# Patient Record
Sex: Female | Born: 1970 | Race: White | Hispanic: No | Marital: Single | Smoking: Former smoker
Health system: Southern US, Community
[De-identification: ages and names within clinical notes are randomized; demographics above are authoritative.]

## PROBLEM LIST (undated history)

## (undated) DIAGNOSIS — F329 Major depressive disorder, single episode, unspecified: Secondary | ICD-10-CM

## (undated) DIAGNOSIS — J45909 Unspecified asthma, uncomplicated: Secondary | ICD-10-CM

## (undated) DIAGNOSIS — F32A Depression, unspecified: Secondary | ICD-10-CM

## (undated) DIAGNOSIS — F419 Anxiety disorder, unspecified: Secondary | ICD-10-CM

## (undated) DIAGNOSIS — N6029 Fibroadenosis of unspecified breast: Secondary | ICD-10-CM

## (undated) DIAGNOSIS — K649 Unspecified hemorrhoids: Secondary | ICD-10-CM

## (undated) DIAGNOSIS — K769 Liver disease, unspecified: Secondary | ICD-10-CM

## (undated) DIAGNOSIS — R55 Syncope and collapse: Secondary | ICD-10-CM

## (undated) DIAGNOSIS — R51 Headache: Secondary | ICD-10-CM

## (undated) HISTORY — DX: Headache: R51

## (undated) HISTORY — PX: BREAST SURGERY: SHX581

## (undated) HISTORY — DX: Unspecified asthma, uncomplicated: J45.909

## (undated) HISTORY — DX: Unspecified hemorrhoids: K64.9

## (undated) HISTORY — DX: Syncope and collapse: R55

## (undated) HISTORY — DX: Depression, unspecified: F32.A

## (undated) HISTORY — DX: Major depressive disorder, single episode, unspecified: F32.9

## (undated) HISTORY — DX: Liver disease, unspecified: K76.9

## (undated) HISTORY — DX: Anxiety disorder, unspecified: F41.9

## (undated) HISTORY — DX: Fibroadenosis of unspecified breast: N60.29

---

## 2001-04-30 ENCOUNTER — Other Ambulatory Visit: Admission: RE | Admit: 2001-04-30 | Discharge: 2001-04-30 | Payer: Self-pay | Admitting: Obstetrics and Gynecology

## 2004-08-28 ENCOUNTER — Emergency Department (HOSPITAL_COMMUNITY): Admission: EM | Admit: 2004-08-28 | Discharge: 2004-08-28 | Payer: Self-pay | Admitting: Emergency Medicine

## 2010-04-22 ENCOUNTER — Emergency Department (HOSPITAL_COMMUNITY): Admission: EM | Admit: 2010-04-22 | Discharge: 2010-04-22 | Payer: Self-pay | Admitting: Emergency Medicine

## 2011-04-27 ENCOUNTER — Encounter: Payer: Self-pay | Admitting: Gastroenterology

## 2011-05-22 ENCOUNTER — Ambulatory Visit: Payer: Self-pay | Admitting: Gastroenterology

## 2012-06-04 ENCOUNTER — Encounter: Payer: Self-pay | Admitting: Gastroenterology

## 2012-07-02 ENCOUNTER — Other Ambulatory Visit (INDEPENDENT_AMBULATORY_CARE_PROVIDER_SITE_OTHER): Payer: Medicare HMO

## 2012-07-02 ENCOUNTER — Ambulatory Visit (INDEPENDENT_AMBULATORY_CARE_PROVIDER_SITE_OTHER): Payer: Medicare HMO | Admitting: Gastroenterology

## 2012-07-02 ENCOUNTER — Encounter: Payer: Self-pay | Admitting: Gastroenterology

## 2012-07-02 VITALS — BP 124/90 | HR 80 | Ht 64.25 in | Wt 179.1 lb

## 2012-07-02 DIAGNOSIS — K7689 Other specified diseases of liver: Secondary | ICD-10-CM

## 2012-07-02 DIAGNOSIS — K769 Liver disease, unspecified: Secondary | ICD-10-CM

## 2012-07-02 LAB — CBC WITH DIFFERENTIAL/PLATELET
Basophils Relative: 0.1 % (ref 0.0–3.0)
Eosinophils Relative: 5.1 % — ABNORMAL HIGH (ref 0.0–5.0)
HCT: 37.3 % (ref 36.0–46.0)
Hemoglobin: 12.5 g/dL (ref 12.0–15.0)
Lymphs Abs: 1.7 10*3/uL (ref 0.7–4.0)
MCV: 85.7 fl (ref 78.0–100.0)
Monocytes Absolute: 0.4 10*3/uL (ref 0.1–1.0)
Monocytes Relative: 6.7 % (ref 3.0–12.0)
Neutro Abs: 3.3 10*3/uL (ref 1.4–7.7)
WBC: 5.7 10*3/uL (ref 4.5–10.5)

## 2012-07-02 LAB — COMPREHENSIVE METABOLIC PANEL
Alkaline Phosphatase: 32 U/L — ABNORMAL LOW (ref 39–117)
BUN: 11 mg/dL (ref 6–23)
CO2: 23 mEq/L (ref 19–32)
Creatinine, Ser: 0.7 mg/dL (ref 0.4–1.2)
GFR: 102.65 mL/min (ref >60.00)
Glucose, Bld: 92 mg/dL (ref 70–99)
Total Bilirubin: 0.4 mg/dL (ref 0.3–1.2)
Total Protein: 6.6 g/dL (ref 6.0–8.3)

## 2012-07-02 LAB — AFP TUMOR MARKER: AFP-Tumor Marker: 1.6 ng/mL (ref 0.0–8.0)

## 2012-07-02 NOTE — Patient Instructions (Addendum)
One of your biggest health concerns is your smoking.  This increases your risk for most cancers and serious cardiovascular diseases such as strokes, heart attacks.  You should try your best to stop.  If you need assistance, please contact your PCP or Smoking Cessation Class at [facility] [phone]) or [facility] Quit-Line (1-800-QUIT-NOW). MRI (with and without contrast) of liver to characterize the liver lesion noted on 05/2012 CT scan   You have been scheduled for an MRI on  07/04/12 at 9 am.  Please arrive 15 minutes prior to your appointment time for registration purposes. Nothing to eat or drink  4 hours prior to the test. If you have any metal in your body, have a pacemaker or defibrillator, please be sure to let your ordering physician know. This test typically takes 45 minutes to 1 hour to complete. You will have labs checked today in the basement lab.  Please head down after you check out with the front desk  (cbc, cmet, AFP). You need to be up to date on age appropriate cancer screening (you should call PCP about this).   Copy of this sent to Dr. Lenon Ahmadi at [facility].

## 2012-07-02 NOTE — Progress Notes (Signed)
HPI: This is a     very pleasant 41 year old woman who is here with her husband today.  Was found to have a "non-specific 13mm by 9mm lesion in right lobe of liver."  She was thought to be passing a kidney stone, had a CT scan.  Sharp/dull acute pains in left side low back. Orthopedice evaluation felt the pain was not musculoskeltal  Has never had liver problems.  Unknown if liver disease in family.  Brother has gilbert's  Overall weight has been up in past 6-8 weeks.  Has tried various diet pills.  No right sided abd pains.  No BCPs no hormone replacement.  Smoke cigs daily, cutting.  Not much etoh weekly.   She has not had a mammogram in many years, she did have a Pap smear last year. She does not have colon cancer in her family.     Review of systems: Pertinent positive and negative review of systems were noted in the above HPI section. Complete review of systems was performed and was otherwise normal.    Past Medical History  Diagnosis Date  . Fibroadenosis of breast   . Unspecified hemorrhoids without mention of complication   . Liver lesion   . Anxiety   . Asthma   . Depression     Past Surgical History  Procedure Date  . Breast surgery     right tumor removed    Current Outpatient Prescriptions  Medication Sig Dispense Refill  . amoxicillin-clavulanate (AUGMENTIN) 875-125 MG per tablet Take 1 tablet by mouth 2 (two) times daily.      . cyclobenzaprine (FLEXERIL) 5 MG tablet Take 5 mg by mouth as needed.        Allergies as of 07/02/2012  . (No Known Allergies)    Family History  Problem Relation Age of Onset  . Diabetes Father   . Diabetes Maternal Grandfather   . Heart disease Mother   . Breast cancer Maternal Aunt   . Lung cancer Maternal Grandfather     maternal aunt  . Throat cancer Maternal Grandfather     maternal uncle  . Hypertension Mother   . Arthritis/Rheumatoid Mother   . Seizures Brother   . Ovarian cancer Maternal Aunt     x 4   . Kidney disease Maternal Aunt   . Other Brother     Gilbert's syndrome    History   Social History  . Marital Status: Single    Spouse Name: N/A    Number of Children: 3  . Years of Education: N/A   Occupational History  . mortgage banking    Social History Main Topics  . Smoking status: Current Every Day Smoker    Types: Cigarettes  . Smokeless tobacco: Never Used  . Alcohol Use: Yes     Comment: 1-2 per night  . Drug Use: No  . Sexually Active: Not on file   Other Topics Concern  . Not on file   Social History Narrative  . No narrative on file       Physical Exam: BP 124/90  Pulse 80  Ht 5' 4.25" (1.632 m)  Wt 179 lb 2 oz (81.251 kg)  BMI 30.51 kg/m2  LMP 06/23/2012 Constitutional: generally well-appearing Psychiatric: alert and oriented x3 Eyes: extraocular movements intact Mouth: oral pharynx moist, no lesions Neck: supple no lymphadenopathy Cardiovascular: heart regular rate and rhythm Lungs: clear to auscultation bilaterally Abdomen: soft, nontender, nondistended, no obvious ascites, no peritoneal signs, normal bowel sounds Extremities:  no lower extremity edema bilaterally Skin: no lesions on visible extremities    Assessment and plan: 41 y.o. female with  incidental small right lobe liver lesion  the best imaging test for lesions of the liver is an MRI with and without IV contrast and we will order that for her. I will also have basic labs done CBC, complete metabolic profile, alpha-fetoprotein. She does not require colon cancer screening at her age. I will defer other screening recommendations to her primary care physician. I do think that she should be up to date on any age appropriate screening given this finding. I think it is unlikely gaining serious however if the MRI results are equivocal then she might require liver biopsy.

## 2012-07-04 ENCOUNTER — Ambulatory Visit (HOSPITAL_COMMUNITY)
Admission: RE | Admit: 2012-07-04 | Discharge: 2012-07-04 | Disposition: A | Discharge status: Home / Self Care | Payer: Medicare HMO | Source: Ambulatory Visit | Attending: Gastroenterology | Admitting: Gastroenterology

## 2012-07-04 DIAGNOSIS — R109 Unspecified abdominal pain: Secondary | ICD-10-CM | POA: Insufficient documentation

## 2012-07-04 DIAGNOSIS — Q619 Cystic kidney disease, unspecified: Secondary | ICD-10-CM | POA: Insufficient documentation

## 2012-07-04 DIAGNOSIS — R319 Hematuria, unspecified: Secondary | ICD-10-CM | POA: Insufficient documentation

## 2012-07-04 DIAGNOSIS — K7689 Other specified diseases of liver: Secondary | ICD-10-CM | POA: Insufficient documentation

## 2012-07-04 DIAGNOSIS — K769 Liver disease, unspecified: Secondary | ICD-10-CM

## 2012-07-04 MED ORDER — GADOBENATE DIMEGLUMINE 529 MG/ML IV SOLN
16.0000 mL | Freq: Once | INTRAVENOUS | Status: AC | PRN
  Administered 2012-07-04: 16 mL via INTRAVENOUS

## 2012-07-07 ENCOUNTER — Emergency Department (HOSPITAL_BASED_OUTPATIENT_CLINIC_OR_DEPARTMENT_OTHER)
Admission: EM | Admit: 2012-07-07 | Discharge: 2012-07-07 | Disposition: A | Discharge status: Home / Self Care | Payer: Managed Care, Other (non HMO) | Attending: Emergency Medicine | Admitting: Emergency Medicine

## 2012-07-07 ENCOUNTER — Encounter (HOSPITAL_BASED_OUTPATIENT_CLINIC_OR_DEPARTMENT_OTHER): Payer: Self-pay | Admitting: Emergency Medicine

## 2012-07-07 DIAGNOSIS — IMO0001 Reserved for inherently not codable concepts without codable children: Secondary | ICD-10-CM | POA: Insufficient documentation

## 2012-07-07 DIAGNOSIS — S61409A Unspecified open wound of unspecified hand, initial encounter: Secondary | ICD-10-CM | POA: Insufficient documentation

## 2012-07-07 DIAGNOSIS — F17200 Nicotine dependence, unspecified, uncomplicated: Secondary | ICD-10-CM | POA: Insufficient documentation

## 2012-07-07 DIAGNOSIS — Z8719 Personal history of other diseases of the digestive system: Secondary | ICD-10-CM | POA: Insufficient documentation

## 2012-07-07 DIAGNOSIS — Z8679 Personal history of other diseases of the circulatory system: Secondary | ICD-10-CM | POA: Insufficient documentation

## 2012-07-07 DIAGNOSIS — J45909 Unspecified asthma, uncomplicated: Secondary | ICD-10-CM | POA: Insufficient documentation

## 2012-07-07 DIAGNOSIS — Z8659 Personal history of other mental and behavioral disorders: Secondary | ICD-10-CM | POA: Insufficient documentation

## 2012-07-07 DIAGNOSIS — Z23 Encounter for immunization: Secondary | ICD-10-CM | POA: Insufficient documentation

## 2012-07-07 DIAGNOSIS — Y939 Activity, unspecified: Secondary | ICD-10-CM | POA: Insufficient documentation

## 2012-07-07 DIAGNOSIS — S61459A Open bite of unspecified hand, initial encounter: Secondary | ICD-10-CM

## 2012-07-07 DIAGNOSIS — S81009A Unspecified open wound, unspecified knee, initial encounter: Secondary | ICD-10-CM | POA: Insufficient documentation

## 2012-07-07 DIAGNOSIS — Z8742 Personal history of other diseases of the female genital tract: Secondary | ICD-10-CM | POA: Insufficient documentation

## 2012-07-07 DIAGNOSIS — S81859A Open bite, unspecified lower leg, initial encounter: Secondary | ICD-10-CM

## 2012-07-07 DIAGNOSIS — Z79899 Other long term (current) drug therapy: Secondary | ICD-10-CM | POA: Insufficient documentation

## 2012-07-07 DIAGNOSIS — Y92009 Unspecified place in unspecified non-institutional (private) residence as the place of occurrence of the external cause: Secondary | ICD-10-CM | POA: Insufficient documentation

## 2012-07-07 MED ORDER — TETANUS-DIPHTH-ACELL PERTUSSIS 5-2.5-18.5 LF-MCG/0.5 IM SUSP
0.5000 mL | Freq: Once | INTRAMUSCULAR | Status: AC
  Administered 2012-07-07: 0.5 mL via INTRAMUSCULAR
  Filled 2012-07-07: qty 0.5

## 2012-07-07 MED ORDER — AMOXICILLIN-POT CLAVULANATE 875-125 MG PO TABS: 1.0000 | ORAL_TABLET | Freq: Two times a day (BID) | ORAL | Status: DC

## 2012-07-07 NOTE — ED Notes (Signed)
Pt bit and scratched left hand and left leg yesterday.  Pt has puncture wounds and scratches to both.  Some swelling noted.  Pt's own cat, no immunizations given as cat is 1 months old.

## 2012-07-07 NOTE — ED Provider Notes (Signed)
History     CSN: [id number]  Arrival date & time 07/07/12  1034   First MD Initiated Contact with Patient 07/07/12 1131      Chief Complaint  Patient presents with  . Animal Bite    (Consider location/radiation/quality/duration/timing/severity/associated sxs/prior treatment) HPI Comments: Patient was bitten on the left hand and left leg by a cat.  Cat belongs to patient, immunizations have not been given yet.  Animal is indoor and otherwise healthy.  Patient is a 41 y.o. female presenting with animal bite. The history is provided by the patient.  Animal Bite  The incident occurred yesterday. The incident occurred at home. She came to the ER via personal transport. Head/neck injury location: left hand, left leg. The pain is moderate. It is unlikely that a foreign body is present.    Past Medical History  Diagnosis Date  . Fibroadenosis of breast   . Unspecified hemorrhoids without mention of complication   . Liver lesion   . Anxiety   . Asthma   . Depression     Past Surgical History  Procedure Date  . Breast surgery     right tumor removed    Family History  Problem Relation Age of Onset  . Diabetes Father   . Diabetes Maternal Grandfather   . Heart disease Mother   . Breast cancer Maternal Aunt   . Lung cancer Maternal Grandfather     maternal aunt  . Throat cancer Maternal Grandfather     maternal uncle  . Hypertension Mother   . Arthritis/Rheumatoid Mother   . Seizures Brother   . Ovarian cancer Maternal Aunt     x 4  . Kidney disease Maternal Aunt   . Other Brother     Gilbert's syndrome    History  Substance Use Topics  . Smoking status: Current Every Day Smoker    Types: Cigarettes  . Smokeless tobacco: Never Used  . Alcohol Use: Yes     Comment: 1-2 per night    OB History    Grav Para Term Preterm Abortions TAB SAB Ect Mult Living                  Review of Systems  All other systems reviewed and are negative.    Allergies    Review of patient's allergies indicates no known allergies.  Home Medications   Current Outpatient Rx  Name  Route  Sig  Dispense  Refill  . AMOXICILLIN-POT CLAVULANATE 875-125 MG PO TABS   Oral   Take 1 tablet by mouth 2 (two) times daily.         . CYCLOBENZAPRINE HCL 5 MG PO TABS   Oral   Take 5 mg by mouth as needed.           BP 137/84  Pulse 81  Temp 98.2 F (36.8 C) (Oral)  Resp 14  Ht 5\' 4"  (1.626 m)  Wt 179 lb (81.194 kg)  BMI 30.73 kg/m2  SpO2 100%  LMP 06/23/2012  Physical Exam  Nursing note and vitals reviewed. Constitutional: She is oriented to person, place, and time. She appears well-developed and well-nourished. No distress.  HENT:  Head: Normocephalic and atraumatic.  Neck: Normal range of motion. Neck supple.  Musculoskeletal: Normal range of motion.       The left hand is noted to have a small puncture to the dorsal aspect between the 4th and 5th metacarpals.  There is mild surrounding erythema.  There is good  range of motion with minimal pain.  Neurological: She is alert and oriented to person, place, and time.  Skin: Skin is warm and dry. She is not diaphoretic.    ED Course  Procedures (including critical care time)  Labs Reviewed - No data to display No results found.   No diagnosis found.    MDM  The patient presents with a cat bite to the hand.  She has been on Augmentin already for a uti.  I will continue this for another 5 days.  The dtap was updated.  To return prn if she worsens.        Geoffery Lyons, MD 07/07/12 1146

## 2017-05-17 ENCOUNTER — Emergency Department (HOSPITAL_COMMUNITY): Payer: 59

## 2017-05-17 ENCOUNTER — Encounter (HOSPITAL_COMMUNITY): Payer: Self-pay | Admitting: Emergency Medicine

## 2017-05-17 ENCOUNTER — Emergency Department (HOSPITAL_COMMUNITY)
Admission: EM | Admit: 2017-05-17 | Discharge: 2017-05-17 | Disposition: A | Discharge status: Home / Self Care | Payer: 59 | Attending: Emergency Medicine | Admitting: Emergency Medicine

## 2017-05-17 ENCOUNTER — Other Ambulatory Visit: Payer: Self-pay

## 2017-05-17 DIAGNOSIS — R42 Dizziness and giddiness: Secondary | ICD-10-CM | POA: Insufficient documentation

## 2017-05-17 DIAGNOSIS — R51 Headache: Secondary | ICD-10-CM | POA: Diagnosis not present

## 2017-05-17 DIAGNOSIS — R002 Palpitations: Secondary | ICD-10-CM | POA: Diagnosis present

## 2017-05-17 DIAGNOSIS — R531 Weakness: Secondary | ICD-10-CM | POA: Diagnosis not present

## 2017-05-17 DIAGNOSIS — R519 Headache, unspecified: Secondary | ICD-10-CM

## 2017-05-17 DIAGNOSIS — F17210 Nicotine dependence, cigarettes, uncomplicated: Secondary | ICD-10-CM | POA: Diagnosis not present

## 2017-05-17 DIAGNOSIS — J45909 Unspecified asthma, uncomplicated: Secondary | ICD-10-CM | POA: Diagnosis not present

## 2017-05-17 LAB — BASIC METABOLIC PANEL
ANION GAP: 6 (ref 5–15)
BUN: 11 mg/dL (ref 6–20)
CALCIUM: 9 mg/dL (ref 8.9–10.3)
CO2: 25 mmol/L (ref 22–32)
CREATININE: 0.64 mg/dL (ref 0.44–1.00)
Chloride: 106 mmol/L (ref 101–111)
GFR calc Af Amer: >60 mL/min (ref >60)
GLUCOSE: 83 mg/dL (ref 65–99)
Potassium: 3.8 mmol/L (ref 3.5–5.1)
Sodium: 137 mmol/L (ref 135–145)

## 2017-05-17 LAB — CBC
HCT: 43.2 % (ref 36.0–46.0)
HEMOGLOBIN: 14 g/dL (ref 12.0–15.0)
MCH: 27.3 pg (ref 26.0–34.0)
MCHC: 32.4 g/dL (ref 30.0–36.0)
MCV: 84.4 fL (ref 78.0–100.0)
Platelets: 254 10*3/uL (ref 150–400)
RBC: 5.12 MIL/uL — ABNORMAL HIGH (ref 3.87–5.11)
RDW: 14.7 % (ref 11.5–15.5)
WBC: 6.2 10*3/uL (ref 4.0–10.5)

## 2017-05-17 LAB — I-STAT TROPONIN, ED: TROPONIN I, POC: 0 ng/mL (ref 0.00–0.08)

## 2017-05-17 LAB — POC URINE PREG, ED: Preg Test, Ur: NEGATIVE

## 2017-05-17 MED ORDER — IBUPROFEN 800 MG PO TABS: 800.0000 mg | ORAL_TABLET | Freq: Three times a day (TID) | ORAL | 0 refills | Status: AC | PRN

## 2017-05-17 MED ORDER — METOCLOPRAMIDE HCL 5 MG/ML IJ SOLN
10.0000 mg | Freq: Once | INTRAMUSCULAR | Status: DC
  Filled 2017-05-17: qty 2

## 2017-05-17 MED ORDER — SODIUM CHLORIDE 0.9 % IV BOLUS (SEPSIS): 1000.0000 mL | Freq: Once | INTRAVENOUS | Status: DC

## 2017-05-17 NOTE — Discharge Instructions (Signed)
It was my pleasure taking care of you today!    Please take Ibuprofen with caffeine three times daily as needed for headache. Extra strength Tylenol for additional pain relief as needed. Stay hydrated. This will help with your headache and dizziness.   It is very important that you call the neurology clinic first thing tomorrow morning to schedule a follow up appointment.   Fortunately, your evaluation today is reassuring with no apparent emergent cause for your headache at this time. With that being said, it is VERY important that you monitor your symptoms at home. If you develop worsening headache, new fever, new neck stiffness, rash, weakness, numbness, trouble with your speech, trouble walking, new or worsening symptoms or any concerning symptoms, please return to the ED immediately.

## 2017-05-17 NOTE — ED Triage Notes (Signed)
To ED via [facility] from home with c/o weakness, dizziness and palpitations-pt had runs of svt for EMS , not captured by EKG--  On arrival, pt states that this weakness and dizziness started yesterday, has

## 2017-05-17 NOTE — ED Provider Notes (Signed)
[facility] Provider Note   CSN: [id number] Arrival date & time: 05/17/17  1345     History   Chief Complaint Chief Complaint  Patient presents with  . Palpitations    HPI Sharon Howard is a 46 y.o. female.  The history is provided by the patient and medical records. No language interpreter was used.  Palpitations   Associated symptoms include headaches, dizziness and weakness. Pertinent negatives include no chest pain.   Sharon Howard is a 46 y.o. female  with a PMH of anxiety, depression, asthma who presents to the Emergency Department complaining of numbness which began yesterday.  She notes that it is intermittent, worse with certain positions like moving from sitting to standing. She describes the dizziness as her vision darkening and then coming back.  It is associated with weakness, blurry vision in both eyes and a tingling sensation on the right side of her head.  She denies syncopal event, but states that her vision will tunnel and she is unable to respond.  She is aware of her surroundings and can hear everything going on around her, but does not feel like she can respond.  She is unsure of timeframe, but feels like this lasts a few seconds to minutes.  EMS was called and per their report, she had a run of SVT with heart rate in the 160s.  This was not captured on EKG.  Heart rate in the 70s in normal sinus rhythm upon arrival to ER.  Chest pain or shortness of breath.  Denies abdominal pain, nausea, vomiting. No fever, chills. She does report having a headache intermittently over the last 4-5 weeks. Denies hx of migraines. Mother was diagnosed with acoustic neuroma and just had surgery for this.    Past Medical History:  Diagnosis Date  . Anxiety   . Asthma   . Depression   . Fibroadenosis of breast   . Liver lesion   . Unspecified hemorrhoids without mention of complication     Patient Active Problem List   Diagnosis Date  Noted  . Liver lesion 07/02/2012    Past Surgical History:  Procedure Laterality Date  . BREAST SURGERY     right tumor removed    OB History    No data available       Home Medications    Prior to Admission medications   Medication Sig Start Date End Date Taking? Authorizing Provider  amoxicillin-clavulanate (AUGMENTIN) 875-125 MG per tablet Take 1 tablet by mouth 2 (two) times daily. One po bid x 7 days Patient not taking: Reported on 05/17/2017 07/07/12   Geoffery Lyonselo, Douglas, MD  ibuprofen (ADVIL,MOTRIN) 800 MG tablet Take 1 tablet (800 mg total) every 8 (eight) hours as needed by mouth. 05/17/17   Daxon Kyne, Chase PicketJaime Pilcher, PA-C    Family History Family History  Problem Relation Age of Onset  . Diabetes Father   . Diabetes Maternal Grandfather   . Lung cancer Maternal Grandfather        maternal aunt  . Throat cancer Maternal Grandfather        maternal uncle  . Heart disease Mother   . Hypertension Mother   . Arthritis/Rheumatoid Mother   . Breast cancer Maternal Aunt   . Seizures Brother   . Ovarian cancer Maternal Aunt        x 4  . Kidney disease Maternal Aunt   . Other Brother        Gilbert's syndrome  Social History Social History   Tobacco Use  . Smoking status: Current Every Day Smoker    Types: Cigarettes  . Smokeless tobacco: Never Used  Substance Use Topics  . Alcohol use: Yes    Comment: 1-2 per night  . Drug use: No     Allergies   Patient has no known allergies.   Review of Systems Review of Systems  Cardiovascular: Positive for palpitations. Negative for chest pain and leg swelling.  Neurological: Positive for dizziness, weakness and headaches.     Physical Exam Updated Vital Signs BP 102/83   Pulse 65   Temp 98.8 F (37.1 C) (Oral)   Resp 12   Wt 81.2 kg (179 lb)   SpO2 97%   BMI 30.73 kg/m   Physical Exam  Constitutional: She is oriented to person, place, and time. She appears well-developed and well-nourished. No distress.   HENT:  Head: Normocephalic and atraumatic.  Neck: Neck supple.  Cardiovascular: Normal rate, regular rhythm and normal heart sounds.  No murmur heard. Regular rate and rhythm on exam and during orthostatic vitals.  Pulmonary/Chest: Effort normal and breath sounds normal. No stridor. No respiratory distress. She has no wheezes. She has no rales.  Abdominal: Soft. She exhibits no distension. There is no tenderness.  Musculoskeletal: She exhibits no edema.  Neurological: She is alert and oriented to person, place, and time.  Alert, oriented, thought content appropriate, able to give a coherent history. Speech is clear and goal oriented, able to follow commands.  Cranial Nerves:  II:  Peripheral visual fields grossly normal, pupils equal, round, reactive to light III, IV, VI: EOM intact bilaterally, ptosis not present V,VII: smile symmetric, eyes kept closed tightly against resistance, facial light touch sensation equal VIII: hearing grossly normal IX, X: symmetric soft palate movement, uvula elevates symmetrically  XI: bilateral shoulder shrug symmetric and strong XII: midline tongue extension 5/5 muscle strength in upper and lower extremities bilaterally including strong and equal grip strength and dorsiflexion/plantar flexion Sensory to light touch normal in all four extremities.  Normal finger-to-nose and rapid alternating movements;  no drift.  Skin: Skin is warm and dry.  Nursing note and vitals reviewed.    ED Treatments / Results  Labs (all labs ordered are listed, but only abnormal results are displayed) Labs Reviewed  CBC - Abnormal; Notable for the following components:      Result Value   RBC 5.12 (*)    All other components within normal limits  BASIC METABOLIC PANEL  I-STAT TROPONIN, ED  POC URINE PREG, ED    EKG  EKG Interpretation  Date/Time:  Thursday May 17 2017 13:46:15 EST Ventricular Rate:  65 PR Interval:    QRS Duration: 93 QT  Interval:  415 QTC Calculation: 432 R Axis:   20 Text Interpretation:  Sinus rhythm Confirmed by Tilden Fossaees, Elizabeth [id number]) on 05/17/2017 2:09:00 PM       Radiology Dg Chest 2 View  Result Date: 05/17/2017 CLINICAL DATA:  Supraventricular tachycardia. Lightheadedness. Dizziness for 1 day. Current smoker. EXAM: CHEST  2 VIEW COMPARISON:  None. FINDINGS: The heart size and mediastinal contours are within normal limits. Both lungs are clear. The visualized skeletal structures are unremarkable. IMPRESSION: No active cardiopulmonary disease. Electronically Signed   By: Elsie StainJohn T Curnes M.D.   On: 05/17/2017 14:40   Ct Head Wo Contrast  Result Date: 05/17/2017 CLINICAL DATA:  Headaches with weakness and dizziness beginning yesterday. No injury. EXAM: CT HEAD WITHOUT CONTRAST TECHNIQUE: Contiguous axial  images were obtained from the base of the skull through the vertex without intravenous contrast. COMPARISON:  None. FINDINGS: Brain: Slight asymmetry of the lateral ventricles right greater than left likely within normal. Ventricles, cisterns and other CSF spaces are otherwise within normal. There is no mass, mass effect, shift of midline structures or acute hemorrhage. No evidence of acute infarction. Vascular: No hyperdense vessel or unexpected calcification. Skull: Normal. Negative for fracture or focal lesion. Sinuses/Orbits: No acute finding. Other: None. IMPRESSION: No acute intracranial findings. Electronically Signed   By: Elberta Fortis M.D.   On: 05/17/2017 15:11    Procedures Procedures (including critical care time)  Medications Ordered in ED Medications  sodium chloride 0.9 % bolus 1,000 mL (not administered)  metoCLOPramide (REGLAN) injection 10 mg (not administered)     Initial Impression / Assessment and Plan / ED Course  I have reviewed the triage vital signs and the nursing notes.  Pertinent labs & imaging results that were available during my care of the patient were reviewed by me  and considered in my medical decision making (see chart for details).    Sharon Howard is a 46 y.o. female who presents to ED for right-sided headache x 4-5 weeks with dizziness beginning last night. Dizziness worse with certain positions, especially moving from lying to sitting or sitting to standing. Afebrile, hemodynamically stable with no focal neuro deficits. EMS reported possible SVT with HR in the 160's in the field however did not capture this on EKG. NSR with HR in there 70's throughout my evaluation and during orthostatics. Labs reviewed and reassuring. CT head with no acute abnormalities. Considered angio for evaluation of possible vertebral dissection however given 4-5 weeks of symptoms, feel this is unlikely. Repeat neuro examination following ED stay again with no deficits. Will refer patient to neurology clinic. Spoke with patient and family about return precautions. All questions answered.   Patient seen by and discussed with Dr. Madilyn Hook who agrees with treatment plan.   Final Clinical Impressions(s) / ED Diagnoses   Final diagnoses:  Bad headache  Dizziness    ED Discharge Orders        Ordered    ibuprofen (ADVIL,MOTRIN) 800 MG tablet  Every 8 hours PRN     05/17/17 1627       Elmer Boutelle, Chase Picket, PA-C 05/17/17 1635    Tilden Fossa, MD 05/18/17 [phone]

## 2017-05-18 ENCOUNTER — Encounter: Payer: Self-pay | Admitting: Neurology

## 2017-06-11 ENCOUNTER — Ambulatory Visit (INDEPENDENT_AMBULATORY_CARE_PROVIDER_SITE_OTHER): Payer: 59 | Admitting: Neurology

## 2017-06-11 ENCOUNTER — Other Ambulatory Visit: Payer: Self-pay

## 2017-06-11 ENCOUNTER — Encounter: Payer: Self-pay | Admitting: Neurology

## 2017-06-11 DIAGNOSIS — R55 Syncope and collapse: Secondary | ICD-10-CM

## 2017-06-11 DIAGNOSIS — H81399 Other peripheral vertigo, unspecified ear: Secondary | ICD-10-CM

## 2017-06-11 DIAGNOSIS — R51 Headache: Secondary | ICD-10-CM

## 2017-06-11 DIAGNOSIS — G4489 Other headache syndrome: Secondary | ICD-10-CM | POA: Diagnosis not present

## 2017-06-11 DIAGNOSIS — R519 Headache, unspecified: Secondary | ICD-10-CM

## 2017-06-11 HISTORY — DX: Syncope and collapse: R55

## 2017-06-11 HISTORY — DX: Headache, unspecified: R51.9

## 2017-06-11 MED ORDER — TOPIRAMATE 25 MG PO TABS: ORAL_TABLET | ORAL | 3 refills | Status: DC

## 2017-06-11 NOTE — Patient Instructions (Signed)
    We will check MRA of the head to look at the circulation to the head.  We will start Topamax for the headache.  Topamax (topiramate) is a seizure medication that has an FDA approval for seizures and for migraine headache. Potential side effects of this medication include weight loss, cognitive slowing, tingling in the fingers and toes, and carbonated drinks will taste bad. If any significant side effects are noted on this drug, please contact our office.

## 2017-06-11 NOTE — Progress Notes (Signed)
Reason for visit: Headache, near syncope  Referring physician: [place]  Lars MageVickie C Klem is a 46 y.o. female  History of present illness:  Ms. Dayton ScrapeMurray is a 46 year old right-handed white female who was seen through the emergency room on 17 May 2017.  The patient began having right occipital headaches about a month prior to this ER visit, the headaches would occur 2 or 3 times a week lasting several hours each time.  The patient does have some phonophobia without photophobia with the headaches, she may have some blurring of vision.  The patient also may experience some dizziness.  The patient fell a day before she went to the emergency room, she had some increase in headache, she also began having some dimming of vision, she would get severely dizzy when she would sit up or stand up, she had an event where she fell to the floor and could not move but she could hear what was going on around her.  The patient has had episodes of visual dimming and dizziness dating back to May 2018.  The patient reports no palpitations of the heart or chest pain.  EKG in the emergency room was normal, heart rate was 65.  The patient denies any numbness or weakness of the face, arms, or legs.  Occasionally she may get some tingling in the head with the headache.  The patient reports no troubles with balance when she is not dizzy.  She denies issues controlling the bowels of the bladder.  Her mother also has migraine headache.  The patient denies any slurred speech, double vision, or difficulty swallowing.  Past Medical History:  Diagnosis Date  . Anxiety   . Asthma   . Depression   . Fibroadenosis of breast   . Liver lesion   . Unspecified hemorrhoids without mention of complication     Past Surgical History:  Procedure Laterality Date  . BREAST SURGERY     right tumor removed    Family History  Problem Relation Age of Onset  . Diabetes Father   . Diabetes Maternal Grandfather   . Lung cancer  Maternal Grandfather        maternal aunt  . Throat cancer Maternal Grandfather        maternal uncle  . Heart disease Mother   . Hypertension Mother   . Arthritis/Rheumatoid Mother   . Breast cancer Maternal Aunt   . Seizures Brother   . Ovarian cancer Maternal Aunt        x 4  . Kidney disease Maternal Aunt   . Other Brother        Gilbert's syndrome    Social history:  reports that she has quit smoking. Her smoking use included cigarettes. she has never used smokeless tobacco. She reports that she drinks alcohol. She reports that she does not use drugs.  Medications:  Prior to Admission medications   Medication Sig Start Date End Date Taking? Authorizing Provider  ibuprofen (ADVIL,MOTRIN) 800 MG tablet Take 1 tablet (800 mg total) every 8 (eight) hours as needed by mouth. 05/17/17  Yes Ward, Chase PicketJaime Pilcher, PA-C     No Known Allergies  ROS:  Out of a complete 14 system review of symptoms, the patient complains only of the following symptoms, and all other reviewed systems are negative.  Dizziness Blurred vision Wheezing Headache, weakness  Blood pressure 135/88, pulse (!) 50, height 5' 4.5" (1.638 m), weight 181 lb (82.1 kg).   Recheck of the pulse  was 72.  Physical Exam  General: The patient is alert and cooperative at the time of the examination.  Eyes: Pupils are equal, round, and reactive to light. Discs are flat bilaterally.  Neck: The neck is supple, no carotid bruits are noted.  Respiratory: The respiratory examination is clear.  Cardiovascular: The cardiovascular examination reveals a regular rate and rhythm, no obvious murmurs or rubs are noted.  Neuromuscular: Range of movement the cervical spine was full.  No crepitus was noted in the temporomandibular joints.  Skin: Extremities are without significant edema.  Neurologic Exam  Mental status: The patient is alert and oriented x 3 at the time of the examination. The patient has apparent normal recent  and remote memory, with an apparently normal attention span and concentration ability.  Cranial nerves: Facial symmetry is present. There is good sensation of the face to pinprick and soft touch bilaterally. The strength of the facial muscles and the muscles to head turning and shoulder shrug are normal bilaterally. Speech is well enunciated, no aphasia or dysarthria is noted. Extraocular movements are full. Visual fields are full. The tongue is midline, and the patient has symmetric elevation of the soft palate. No obvious hearing deficits are noted.  Motor: The motor testing reveals 5 over 5 strength of all 4 extremities. Good symmetric motor tone is noted throughout.  Sensory: Sensory testing is intact to pinprick, soft touch, vibration sensation, and position sense on all 4 extremities. No evidence of extinction is noted.  Coordination: Cerebellar testing reveals good finger-nose-finger and heel-to-shin bilaterally.  Gait and station: Gait is normal. Tandem gait is normal. Romberg is negative. No drift is seen.  Reflexes: Deep tendon reflexes are symmetric and normal bilaterally. Toes are downgoing bilaterally.    CT head 05/17/17:   IMPRESSION: No acute intracranial findings.  * CT scan images were reviewed online. I agree with the written report.    Assessment/Plan:  1.  Right occipital headache, possible migraine  2.  Episodic near syncope  The patient has had headaches that began about 2 months ago at this point.  The patient had only occasional mild headaches in the front of the head previously.  She does have a family history of migraine.  MRA will be done to exclude the possibility of a vertebral artery dissection or vertebrobasilar insufficiency.  The patient will be placed on Topamax for presumed migraine.  She will follow-up in 3 months.  Marlan Palau. Keith Destinee Taber MD 06/11/2017 9:55 AM  [facility] [address] [place], [place]  [id number]  Phone [phone] Fax [phone]

## 2017-06-19 ENCOUNTER — Other Ambulatory Visit: Payer: Self-pay | Admitting: Neurology

## 2017-06-22 ENCOUNTER — Ambulatory Visit
Admission: RE | Admit: 2017-06-22 | Discharge: 2017-06-22 | Disposition: A | Discharge status: Home / Self Care | Payer: 59 | Source: Ambulatory Visit | Attending: Neurology | Admitting: Neurology

## 2017-06-22 DIAGNOSIS — R55 Syncope and collapse: Secondary | ICD-10-CM

## 2017-06-22 DIAGNOSIS — G4489 Other headache syndrome: Secondary | ICD-10-CM | POA: Diagnosis not present

## 2017-06-22 DIAGNOSIS — H81399 Other peripheral vertigo, unspecified ear: Secondary | ICD-10-CM

## 2017-06-24 ENCOUNTER — Telehealth: Payer: Self-pay | Admitting: Neurology

## 2017-06-24 NOTE — Telephone Encounter (Signed)
I called the patient.  MRA of the head was unremarkable.  No evidence of dissection.   MRA head 06/24/17:  IMPRESSION:   Unremarkable MRA of brain showing no significant stenosis of large and medium size intracranial vessels.

## 2017-07-04 ENCOUNTER — Telehealth: Payer: Self-pay | Admitting: *Deleted

## 2017-07-04 MED ORDER — TOPIRAMATE 25 MG PO TABS: ORAL_TABLET | ORAL | 0 refills | Status: DC

## 2017-07-04 NOTE — Telephone Encounter (Signed)
Called pt. Received 90 day refill request from [facility] [address] for rx topiramate. She states she is taking topiramate 25mg  3 tabs at bedtime now. Tolerating okay. Agreeable for us to send 90 day refill.

## 2017-09-03 ENCOUNTER — Ambulatory Visit: Payer: 59 | Admitting: Neurology

## 2017-10-08 ENCOUNTER — Encounter: Payer: Self-pay | Admitting: Adult Health

## 2017-10-08 ENCOUNTER — Ambulatory Visit (INDEPENDENT_AMBULATORY_CARE_PROVIDER_SITE_OTHER): Payer: 59 | Admitting: Adult Health

## 2017-10-08 VITALS — BP 120/82 | HR 83 | Wt 180.2 lb

## 2017-10-08 DIAGNOSIS — G4489 Other headache syndrome: Secondary | ICD-10-CM

## 2017-10-08 MED ORDER — TOPIRAMATE 50 MG PO TABS: 100.0000 mg | ORAL_TABLET | Freq: Every day | ORAL | 3 refills | Status: AC

## 2017-10-08 NOTE — Patient Instructions (Addendum)
Your Plan:  Increase Topamax 100 mg at bedtime If your symptoms worsen or you develop new symptoms please let us know.   Thank you for coming to see [facility] at [facility]. I hope we have been able to provide you high quality care today.  You may receive a patient satisfaction survey over the next few weeks. We would appreciate your feedback and comments so that we may continue to improve ourselves and the health of our patients.

## 2017-10-08 NOTE — Progress Notes (Signed)
PATIENT: Sharon Howard DOB: 06-Jan-1971  REASON FOR VISIT: follow up HISTORY FROM: patient  HISTORY OF PRESENT ILLNESS: Today 10/08/17 Sharon Howard is a 47 year old female with a history of migraine headaches.  She returns today for follow-up.  She states that Topamax has helped some but not resolved her headache.  She reports that she does have some numbness and tingling in the hands and feet since starting Topamax.  She states that she continues to have 2-3 headaches a week.  They continue to occur in the right occipital region.  She denies significant photophobia, phonophobia nausea or vomiting.  She states typically she can ibuprofen laying down and the headache will resolve.  She states sometimes the headache can  last 30 minutes other times it can last hours.  The patient is under a lot of stress.  She recently took a steady of her 65-year-old grandson.  She also has an 39-week old granddaughter that she helps care for her medical issues.  She states on average some nights she may only get 4 hours of sleep.  She returns today for follow-up.  HISTORY 06/11/17: Ms. Sharon Howard is a 47 year old right-handed white female who was seen through the emergency room on 17 May 2017.  The patient began having right occipital headaches about a month prior to this ER visit, the headaches would occur 2 or 3 times a week lasting several hours each time.  The patient does have some phonophobia without photophobia with the headaches, she may have some blurring of vision.  The patient also may experience some dizziness.  The patient fell a day before she went to the emergency room, she had some increase in headache, she also began having some dimming of vision, she would get severely dizzy when she would sit up or stand up, she had an event where she fell to the floor and could not move but she could hear what was going on around her.  The patient has had episodes of visual dimming and dizziness dating back to May  2018.  The patient reports no palpitations of the heart or chest pain.  EKG in the emergency room was normal, heart rate was 65.  The patient denies any numbness or weakness of the face, arms, or legs.  Occasionally she may get some tingling in the head with the headache.  The patient reports no troubles with balance when she is not dizzy.  She denies issues controlling the bowels of the bladder.  Her mother also has migraine headache.  The patient denies any slurred speech, double vision, or difficulty swallowing.    REVIEW OF SYSTEMS: Out of a complete 14 system review of symptoms, the patient complains only of the following symptoms, and all other reviewed systems are negative.  See HPI  ALLERGIES: No Known Allergies  HOME MEDICATIONS: Outpatient Medications Prior to Visit  Medication Sig Dispense Refill  . ibuprofen (ADVIL,MOTRIN) 800 MG tablet Take 1 tablet (800 mg total) every 8 (eight) hours as needed by mouth. 21 tablet 0  . topiramate (TOPAMAX) 25 MG tablet Take 3 tablets at night by mouth 270 tablet 0   No facility-administered medications prior to visit.     PAST MEDICAL HISTORY: Past Medical History:  Diagnosis Date  . Anxiety   . Asthma   . Depression   . Fibroadenosis of breast   . Headache 06/11/2017  . Liver lesion   . Near syncope 06/11/2017  . Unspecified hemorrhoids without mention of complication  PAST SURGICAL HISTORY: Past Surgical History:  Procedure Laterality Date  . BREAST SURGERY     right tumor removed    FAMILY HISTORY: Family History  Problem Relation Age of Onset  . Diabetes Father   . Diabetes Maternal Grandfather   . Lung cancer Maternal Grandfather        maternal aunt  . Throat cancer Maternal Grandfather        maternal uncle  . Heart disease Mother   . Hypertension Mother   . Arthritis/Rheumatoid Mother   . Other Mother        brain tumor  . Breast cancer Maternal Aunt   . Seizures Brother        grand mal  . Other  Brother        Gilberts syndrome  . Ovarian cancer Maternal Aunt        x 4  . Kidney disease Maternal Aunt   . Other Sister        brain tumors on optic nerve, noncancerous    SOCIAL HISTORY: Social History   Socioeconomic History  . Marital status: Single    Spouse name: Casimiro Needle  . Number of children: 3  . Years of education: 12+  . Highest education level: Not on file  Occupational History  . Occupation: mortgage Archivist: [employer]  Social Needs  . Financial resource strain: Not on file  . Food insecurity:    Worry: Not on file    Inability: Not on file  . Transportation needs:    Medical: Not on file    Non-medical: Not on file  Tobacco Use  . Smoking status: Former Smoker    Types: Cigarettes  . Smokeless tobacco: Never Used  Substance and Sexual Activity  . Alcohol use: Yes    Comment: 1-2 per night  . Drug use: No  . Sexual activity: Not on file  Lifestyle  . Physical activity:    Days per week: Not on file    Minutes per session: Not on file  . Stress: Not on file  Relationships  . Social connections:    Talks on phone: Not on file    Gets together: Not on file    Attends religious service: Not on file    Active member of club or organization: Not on file    Attends meetings of clubs or organizations: Not on file    Relationship status: Not on file  . Intimate partner violence:    Fear of current or ex partner: Not on file    Emotionally abused: Not on file    Physically abused: Not on file    Forced sexual activity: Not on file  Other Topics Concern  . Not on file  Social History Narrative   Lives with husband   Caffeine use: daily   Right handed       PHYSICAL EXAM  Vitals:   10/08/17 0803  BP: 120/82  Pulse: 83  Weight: 180 lb 3.2 oz (81.7 kg)   Body mass index is 30.45 kg/m.  Generalized: Well developed, in no acute distress   Neurological examination  Mentation: Alert oriented to time, place, history taking.  Follows all commands speech and language fluent Cranial nerve II-XII: Pupils were equal round reactive to light. Extraocular movements were full, visual field were full on confrontational test. Facial sensation and strength were normal. Uvula tongue midline. Head turning and shoulder shrug  were normal and symmetric. Motor: The motor  testing reveals 5 over 5 strength of all 4 extremities. Good symmetric motor tone is noted throughout.  Sensory: Sensory testing is intact to soft touch on all 4 extremities. No evidence of extinction is noted.  Coordination: Cerebellar testing reveals good finger-nose-finger and heel-to-shin bilaterally.  Gait and station: Gait is normal. Tandem gait is normal. Romberg is negative. No drift is seen.  Reflexes: Deep tendon reflexes are symmetric and normal bilaterally.   DIAGNOSTIC DATA (LABS, IMAGING, TESTING) - I reviewed patient records, labs, notes, testing and imaging myself where available.  Lab Results  Component Value Date   WBC 6.2 05/17/2017   HGB 14.0 05/17/2017   HCT 43.2 05/17/2017   MCV 84.4 05/17/2017   PLT 254 05/17/2017      Component Value Date/Time   NA 137 05/17/2017 1351   K 3.8 05/17/2017 1351   CL 106 05/17/2017 1351   CO2 25 05/17/2017 1351   GLUCOSE 83 05/17/2017 1351   BUN 11 05/17/2017 1351   CREATININE 0.64 05/17/2017 1351   CALCIUM 9.0 05/17/2017 1351   PROT 6.6 07/02/2012 0931   ALBUMIN 3.8 07/02/2012 0931   AST 17 07/02/2012 0931   ALT 25 07/02/2012 0931   ALKPHOS 32 (L) 07/02/2012 0931   BILITOT 0.4 07/02/2012 0931   GFRNONAA >60 05/17/2017 1351   GFRAA >60 05/17/2017 1351      ASSESSMENT AND PLAN 47 y.o. year old female  has a past medical history of Anxiety, Asthma, Depression, Fibroadenosis of breast, Headache (06/11/2017), Liver lesion, Near syncope (06/11/2017), and Unspecified hemorrhoids without mention of complication. here with:  1.  Headaches  Overall the patient has noted some benefit with Topamax.  I  will increase Topamax to 100 mg at bedtime.  I called in the 50 mg tablets for the patient.  She is unable to tolerate the increase in Topamax she will let us know.  We will follow-up in 6 months or sooner if needed.   I spent 15 minutes with the patient. 50% of this time was spent discussing her medication   Butch PennyMegan Karder Goodin, MSN, NP-C 10/08/2017, 8:13 AM [facility] [address], [address] [place], [place] [address] [phone]

## 2017-10-08 NOTE — Progress Notes (Signed)
I have read the note, and I agree with the clinical assessment and plan.  Charlei Ramsaran K Danielys Madry   

## 2017-10-14 ENCOUNTER — Other Ambulatory Visit: Payer: Self-pay | Admitting: Neurology

## 2017-11-06 ENCOUNTER — Telehealth: Payer: Self-pay | Admitting: Adult Health

## 2017-11-06 NOTE — Telephone Encounter (Signed)
Pt is going on a cruise next Friday and due to pt having dizzy issues on land she would like something for motion sickness on her cruise.  If this can be done please send to  [id number] [place], [place] - [address] [phone] (Phone) [phone] (Fax)

## 2017-11-06 NOTE — Telephone Encounter (Signed)
LVM informing patient to try OTC Dramamine or similar OTC motion sickness medication. Advised if she has tried med in past that worked, she could try it again. Left number for any questions.

## 2017-11-06 NOTE — Telephone Encounter (Signed)
She can try OTC Dramamine. Has she tried something in the past that worked for her?

## 2018-04-09 ENCOUNTER — Ambulatory Visit: Payer: 59 | Admitting: Adult Health

## 2018-08-16 IMAGING — CT CT HEAD W/O CM
4 series · 17 of 47 positions shown, 19 images · non-contrast
Comparison: None.

CLINICAL DATA: Headaches with weakness and dizziness beginning
yesterday. No injury.

EXAM:
CT HEAD WITHOUT CONTRAST
TECHNIQUE: Contiguous axial images were obtained from the base of the skull
through the vertex without intravenous contrast.

[Series 3: head wo · axial · 0.42mm/px · z∈[-170,-50]mm · 7 of 33 slices shown, 9 images]
[im 5/33  brain]
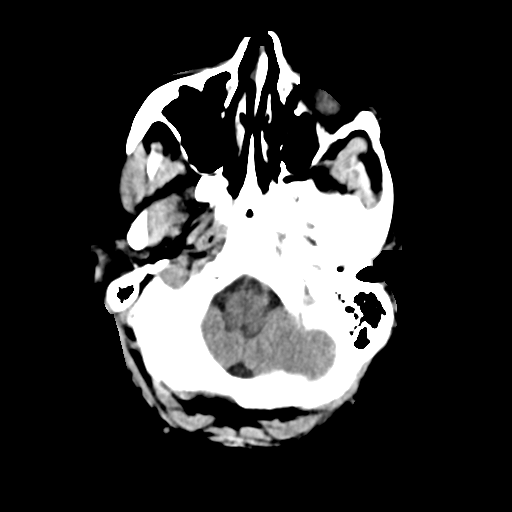
[im 5/33  bone]
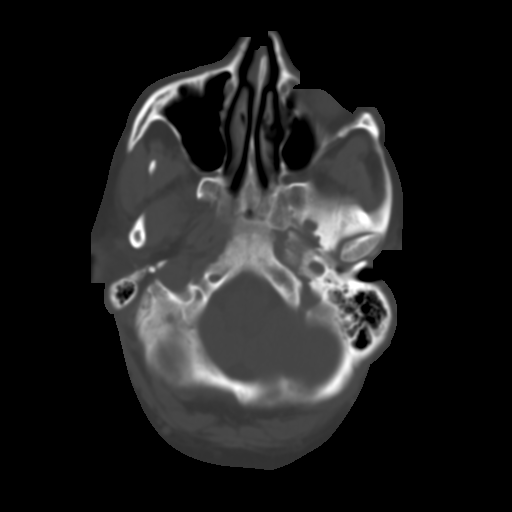
[im 9/33  brain]
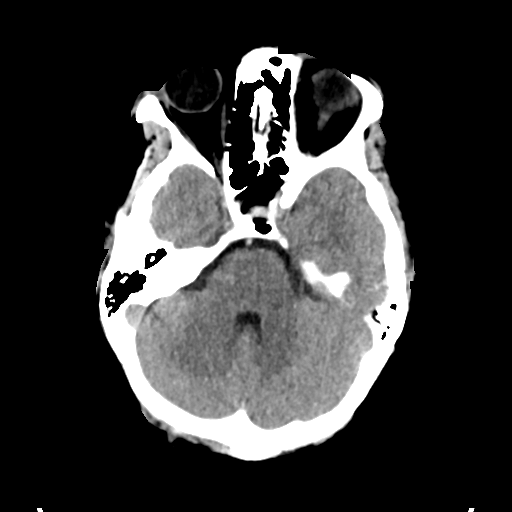
[im 13/33  brain]
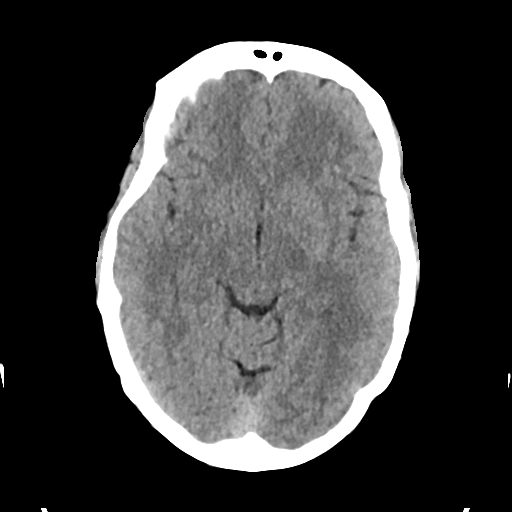
[im 17/33  brain]
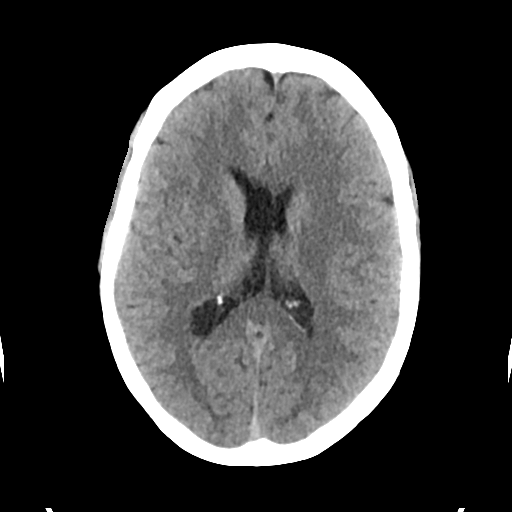
[im 21/33  brain]
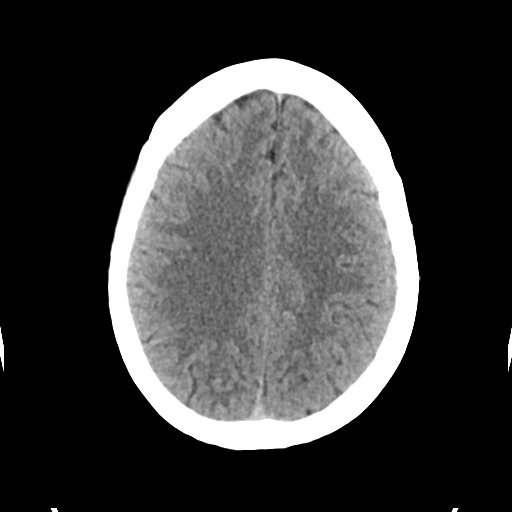
[im 21/33  bone]
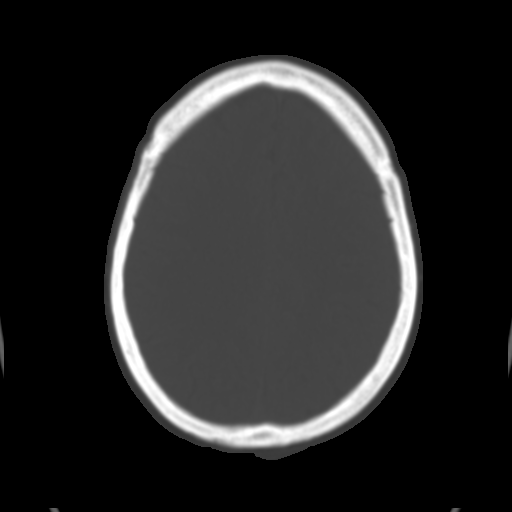
[im 25/33  brain]
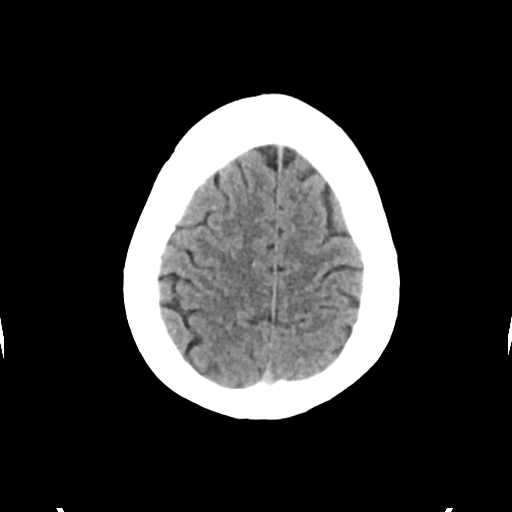
[im 29/33  brain]
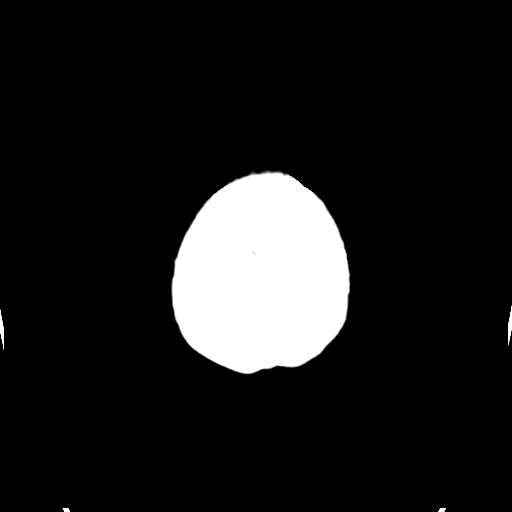

[Series 4: head bone · axial · 0.42mm/px · z∈[-174,-118]mm · 4 of 83 slices shown]
[im 9/83  bone]
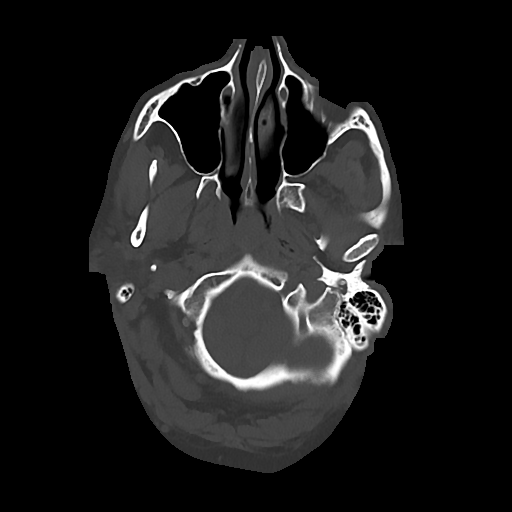
[im 17/83  bone]
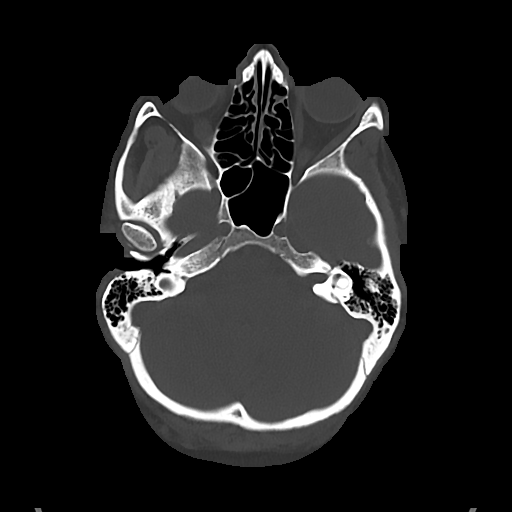
[im 25/83  bone]
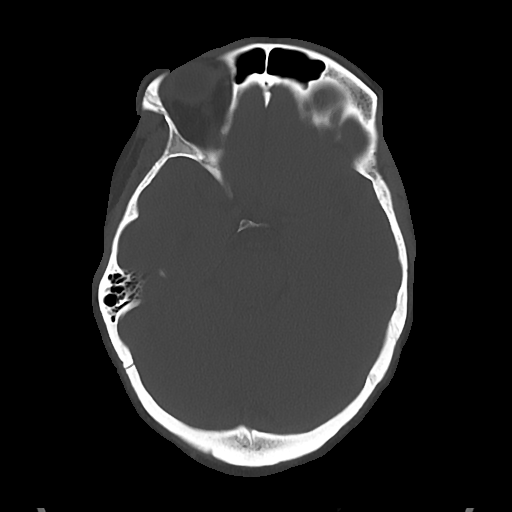
[im 37/83  bone]
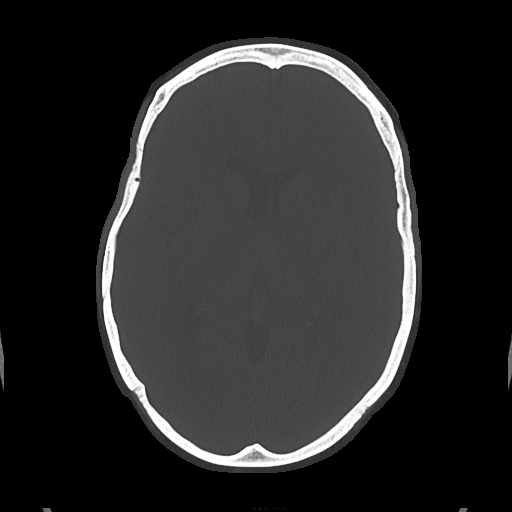

[Series 5: cor soft · coronal · 0.32mm/px · 3 of 71 slices shown]
[im 24/71  brain]
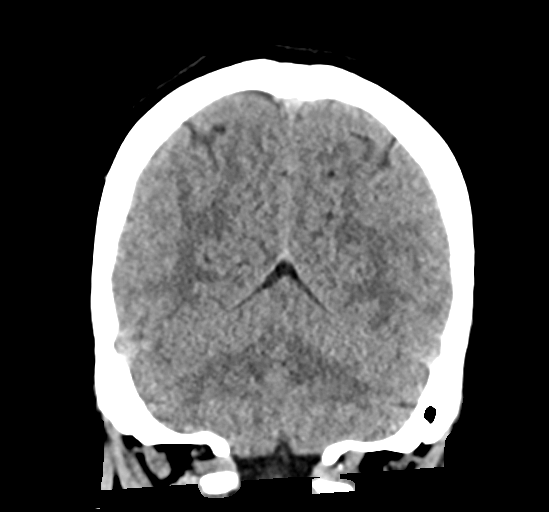
[im 32/71  brain]
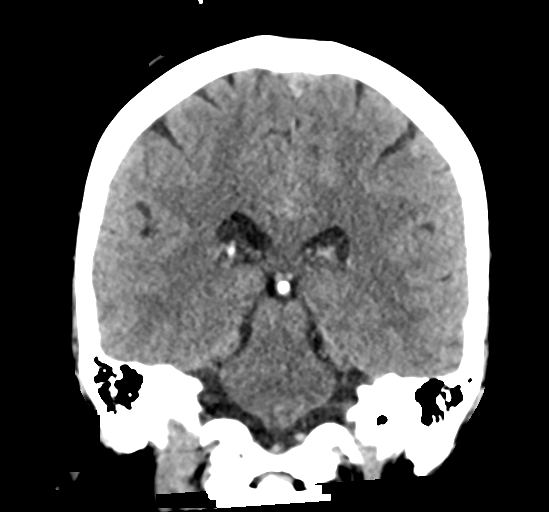
[im 39/71  brain]
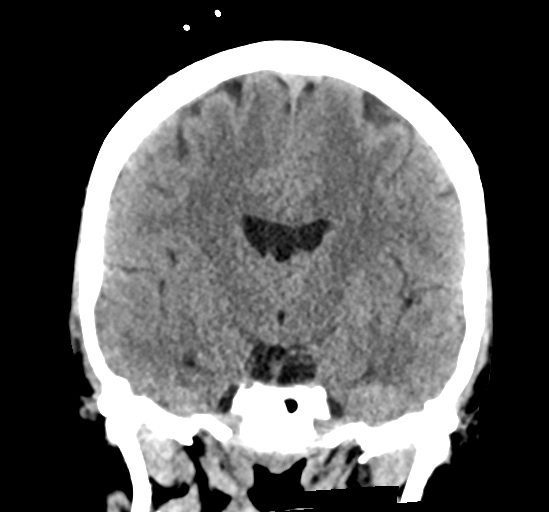

[Series 6: sag soft · sagittal · 0.36mm/px · 3 of 60 slices shown]
[im 20/60  brain]
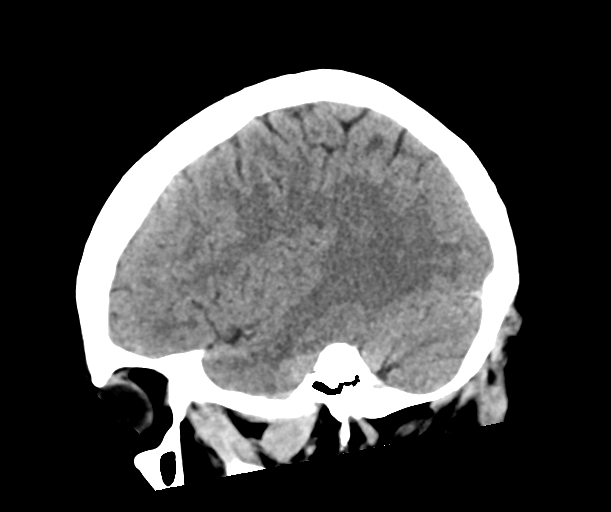
[im 30/60  brain]
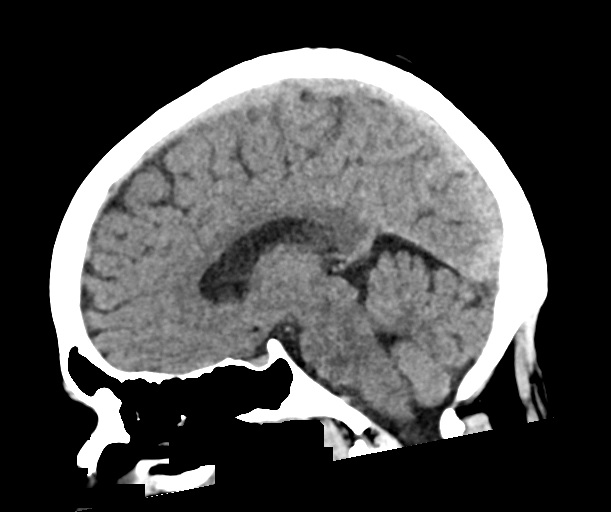
[im 40/60  brain]
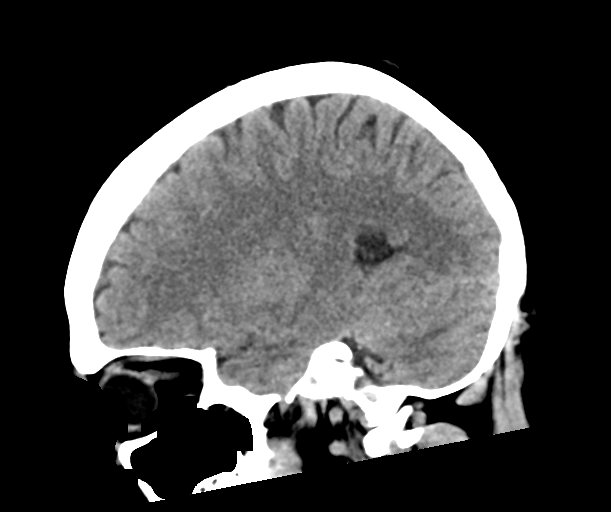

[17 of 47 positions shown; findings below may reference images not displayed]

FINDINGS: Brain: Slight asymmetry of the lateral ventricles right greater than
left likely within normal. Ventricles, cisterns and other CSF spaces
are otherwise within normal. There is no mass, mass effect, shift of
midline structures or acute hemorrhage. No evidence of acute
infarction.

Vascular: No hyperdense vessel or unexpected calcification.

Skull: Normal. Negative for fracture or focal lesion.

Sinuses/Orbits: No acute finding.

Other: None.
IMPRESSION: No acute intracranial findings.
